# Patient Record
Sex: Male | Born: 1950
Health system: Southern US, Community
[De-identification: ages and names within clinical notes are randomized; demographics above are authoritative.]

## PROBLEM LIST (undated history)

## (undated) DIAGNOSIS — F419 Anxiety disorder, unspecified: Secondary | ICD-10-CM

## (undated) DIAGNOSIS — K219 Gastro-esophageal reflux disease without esophagitis: Secondary | ICD-10-CM

---

## 2007-01-19 ENCOUNTER — Observation Stay: Payer: Self-pay | Admitting: Internal Medicine

## 2007-01-19 ENCOUNTER — Other Ambulatory Visit: Payer: Self-pay

## 2009-11-19 ENCOUNTER — Ambulatory Visit: Payer: Self-pay | Admitting: Family Medicine

## 2013-07-30 ENCOUNTER — Emergency Department: Payer: Self-pay | Admitting: Emergency Medicine

## 2013-07-30 LAB — CBC WITH DIFFERENTIAL/PLATELET
Basophil #: 0.1 10*3/uL (ref 0.0–0.1)
Eosinophil %: 4.3 %
HGB: 16.8 g/dL (ref 13.0–18.0)
Lymphocyte %: 35.2 %
MCH: 31.6 pg (ref 26.0–34.0)
MCHC: 35.1 g/dL (ref 32.0–36.0)
MCV: 90 fL (ref 80–100)
Monocyte #: 0.6 x10 3/mm (ref 0.2–1.0)
Monocyte %: 7.3 %
Neutrophil #: 4 10*3/uL (ref 1.4–6.5)
WBC: 7.7 10*3/uL (ref 3.8–10.6)

## 2013-07-30 LAB — COMPREHENSIVE METABOLIC PANEL
Albumin: 3.6 g/dL (ref 3.4–5.0)
Anion Gap: 4 — ABNORMAL LOW (ref 7–16)
BUN: 19 mg/dL — ABNORMAL HIGH (ref 7–18)
Bilirubin,Total: 0.4 mg/dL (ref 0.2–1.0)
Calcium, Total: 8.6 mg/dL (ref 8.5–10.1)
Chloride: 107 mmol/L (ref 98–107)
Co2: 24 mmol/L (ref 21–32)
Creatinine: 1.02 mg/dL (ref 0.60–1.30)
EGFR (African American): >60
EGFR (Non-African Amer.): >60
Osmolality: 274 (ref 275–301)
SGOT(AST): 33 U/L (ref 15–37)
Total Protein: 7.4 g/dL (ref 6.4–8.2)

## 2014-04-29 ENCOUNTER — Ambulatory Visit: Payer: Self-pay | Admitting: Urology

## 2015-02-13 IMAGING — CT CT HEAD WITHOUT CONTRAST
1 series · 16 of 30 positions shown, 20 images · non-contrast
Comparison: None available for comparison at time of study
interpretation.

CLINICAL DATA: Left-sided body numbness.

EXAM:
CT HEAD WITHOUT CONTRAST
TECHNIQUE: Contiguous axial images were obtained from the base of the skull
through the vertex without intravenous contrast.

[Series 2: head wo · axial · 0.46mm/px · z∈[-152,-17]mm · 16 of 34 slices shown, 20 images]
[im 2/34  brain]
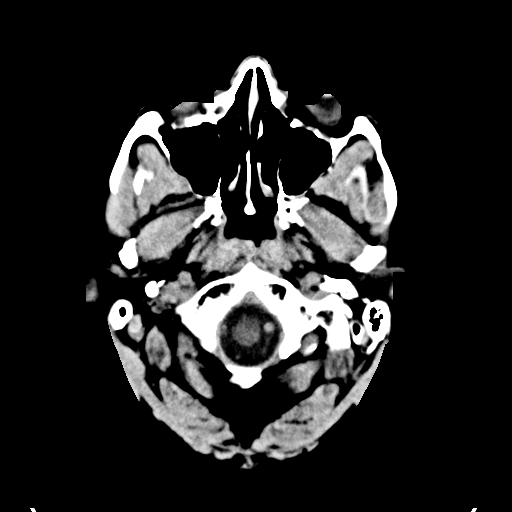
[im 2/34  bone]
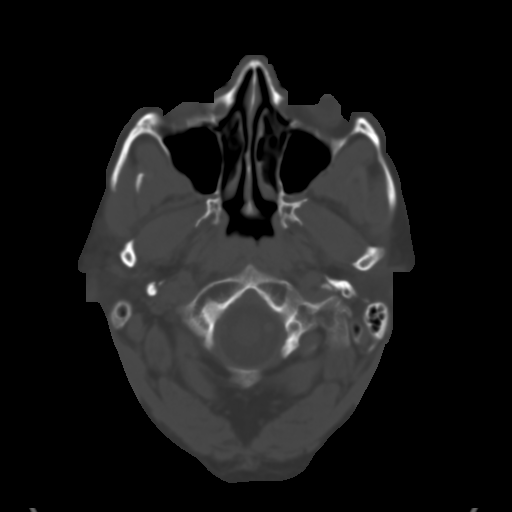
[im 4/34  brain]
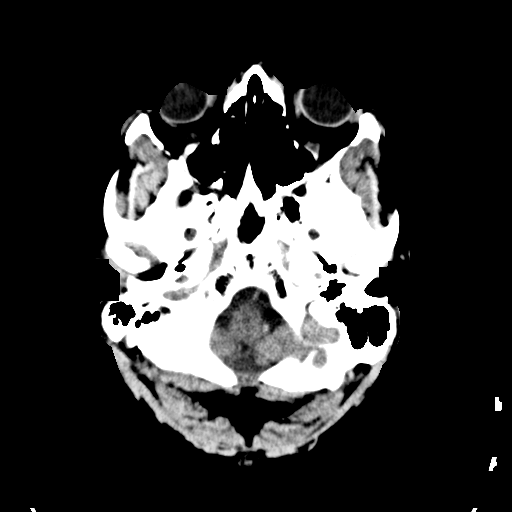
[im 6/34  brain]
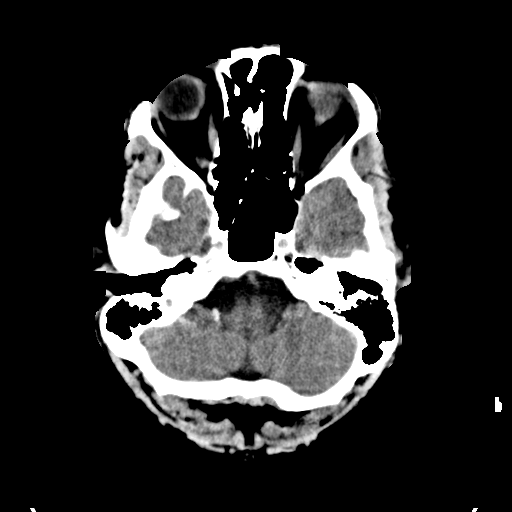
[im 8/34  brain]
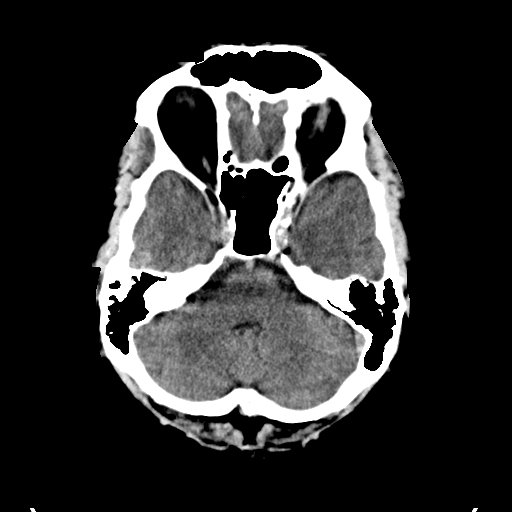
[im 10/34  brain]
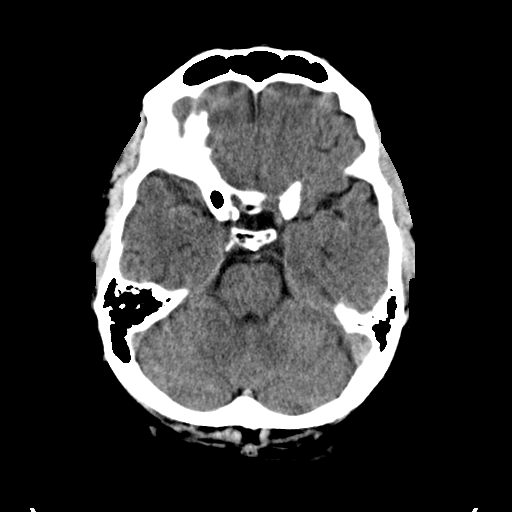
[im 10/34  bone]
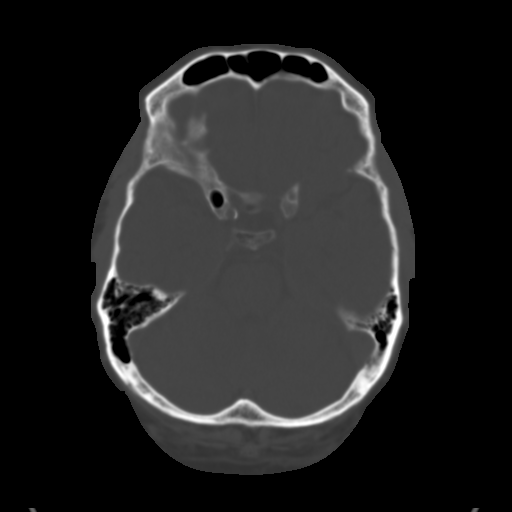
[im 12/34  brain]
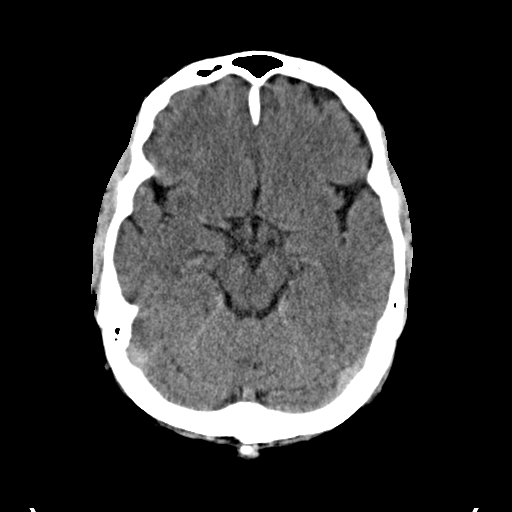
[im 14/34  brain]
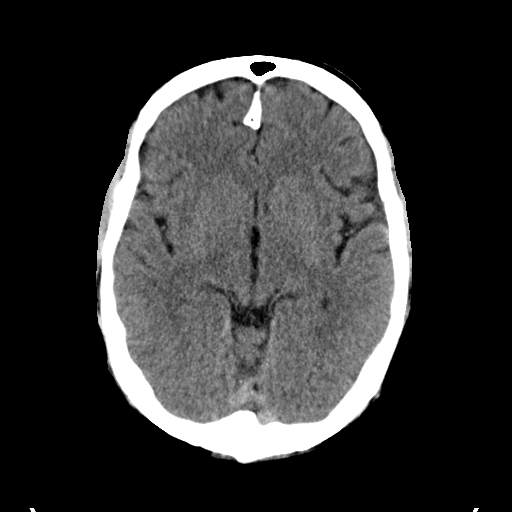
[im 16/34  brain]
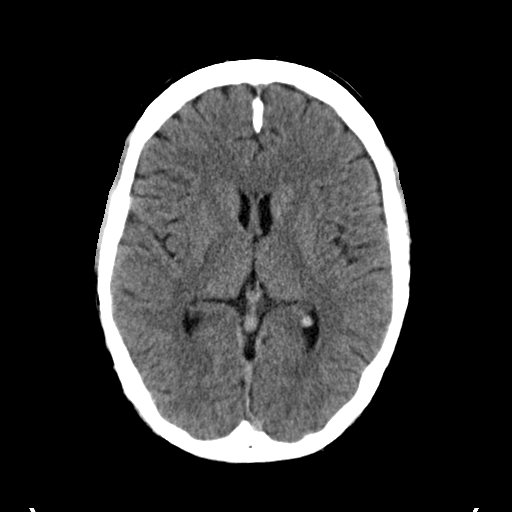
[im 18/34  brain]
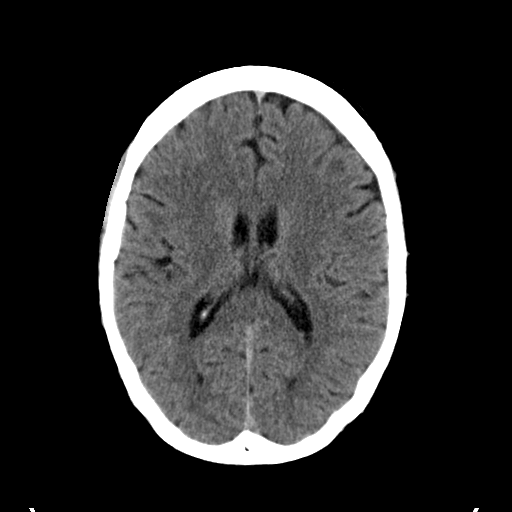
[im 18/34  bone]
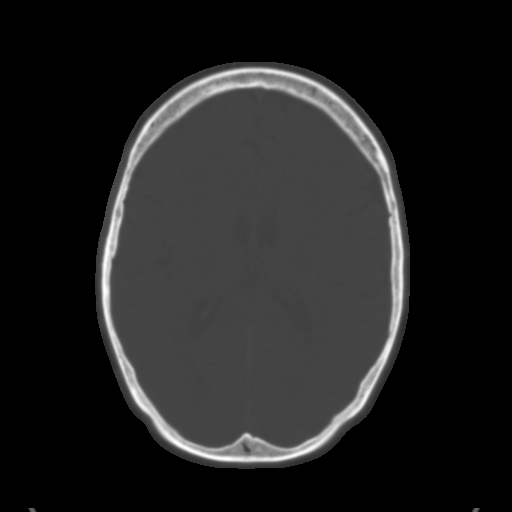
[im 20/34  brain]
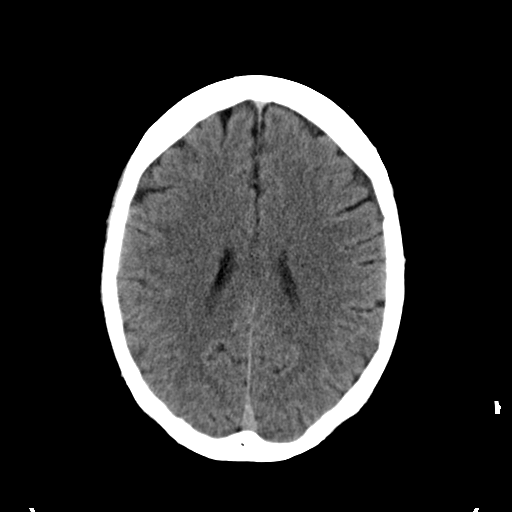
[im 22/34  brain]
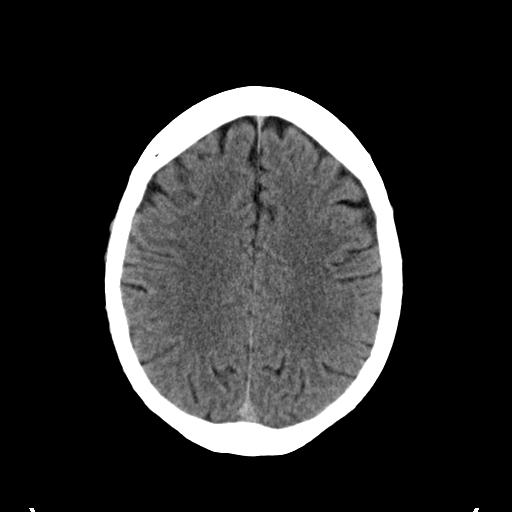
[im 24/34  brain]
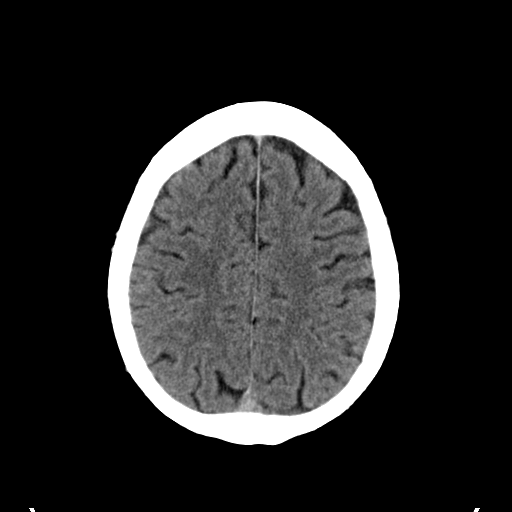
[im 26/34  brain]
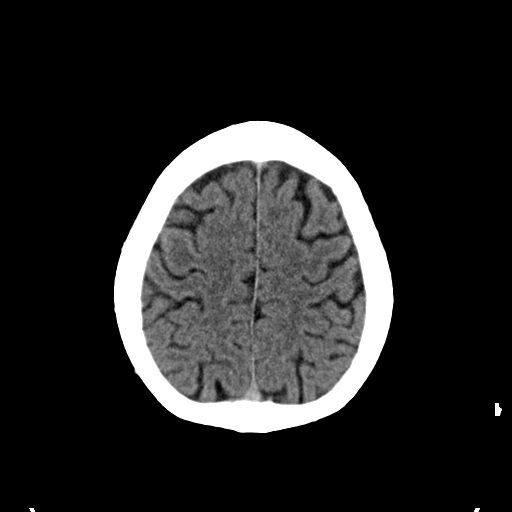
[im 26/34  bone]
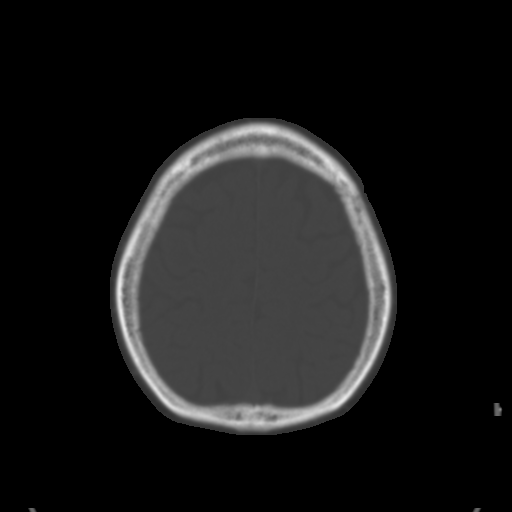
[im 28/34  brain]
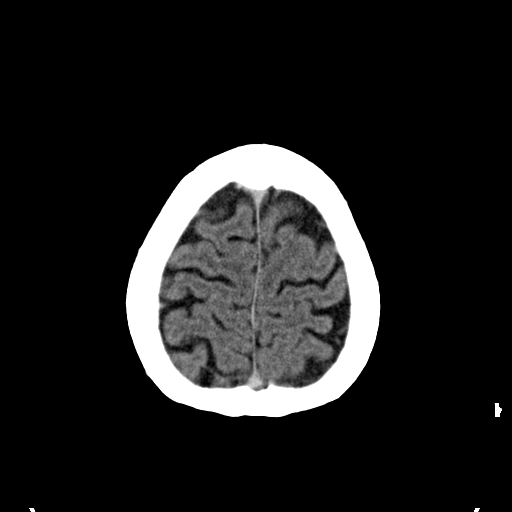
[im 30/34  brain]
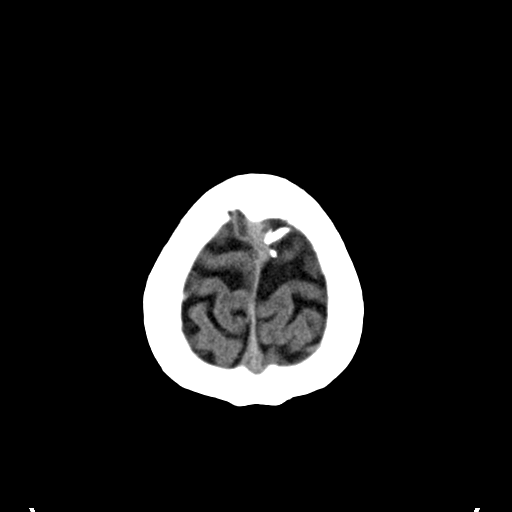
[im 32/34  brain]
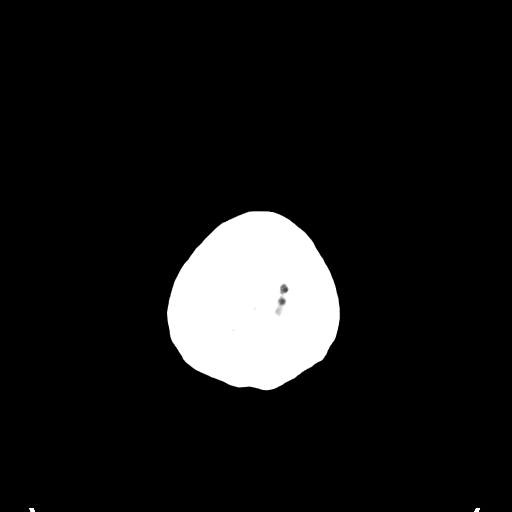

[16 of 30 positions shown; findings below may reference images not displayed]

FINDINGS: The ventricles and sulci are normal. No intraparenchymal hemorrhage,
mass effect nor midline shift. No acute large vascular territory
infarcts.

No abnormal extra-axial fluid collections. Basal cisterns are
patent. Coarse calcification along the anterior falx, benign
finding. Mild calcific atherosclerosis of the carotid siphons.

No skull fracture. Visualized paranasal sinuses and mastoid
air-cells are well-aerated. The included ocular globes and orbital
contents are non-suspicious.
IMPRESSION: No acute intracranial process: Normal noncontrast CT of the head. If
clinical concern for acute ischemia, MRI of the brain with
diffusion-weighted sequences would be more sensitive.

  By: Marky X Stephanie Sookun

## 2015-11-13 IMAGING — CT CT ABDOMEN AND PELVIS WITHOUT AND WITH CONTRAST
2 of 10 series · 10 of 46 positions shown, 16 images · IV contrast (isovue)
Comparison: None.

CLINICAL DATA: Microhematuria

EXAM:
CT ABDOMEN AND PELVIS WITHOUT AND WITH CONTRAST
TECHNIQUE: Multidetector CT imaging of the abdomen and pelvis was performed
following the standard protocol before and following the bolus
administration of intravenous contrast.
CONTRAST:  100 cc Isovue 370

[Series 7: delay · axial · delayed · 0.78mm/px · z∈[-1066,-681]mm · 8 of 100 slices shown, 13 images]
[im 12/100  soft-tissue]
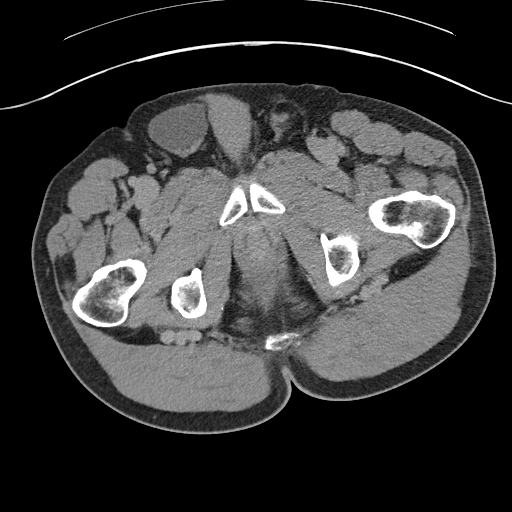
[im 12/100  bone]
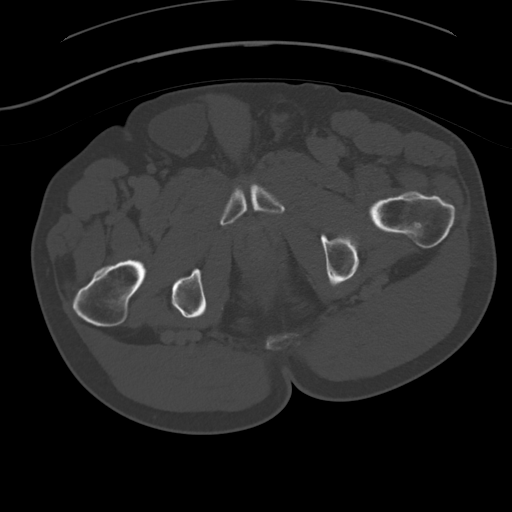
[im 23/100  soft-tissue]
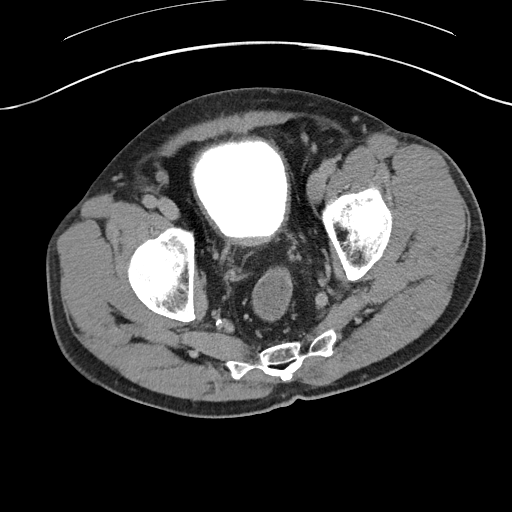
[im 34/100  soft-tissue]
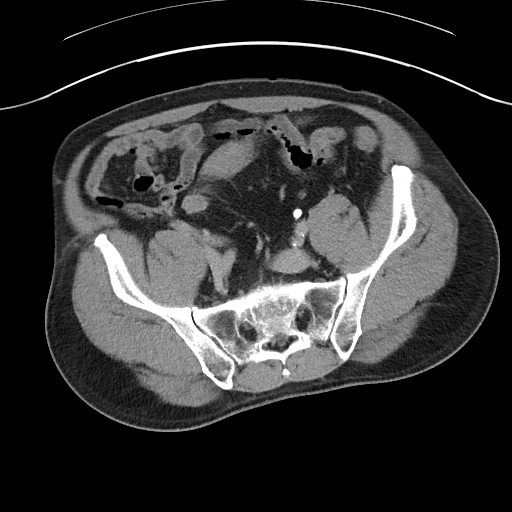
[im 45/100  soft-tissue]
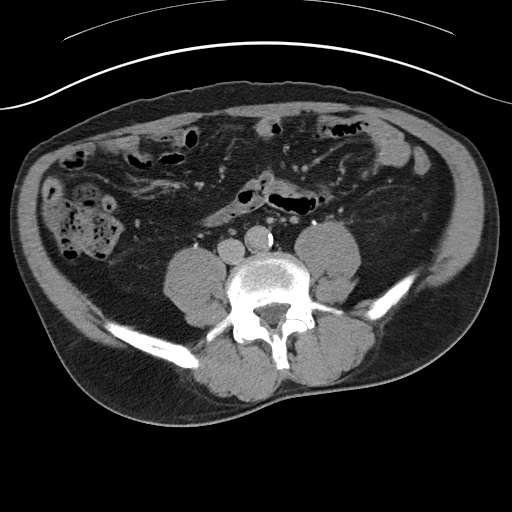
[im 56/100  soft-tissue]
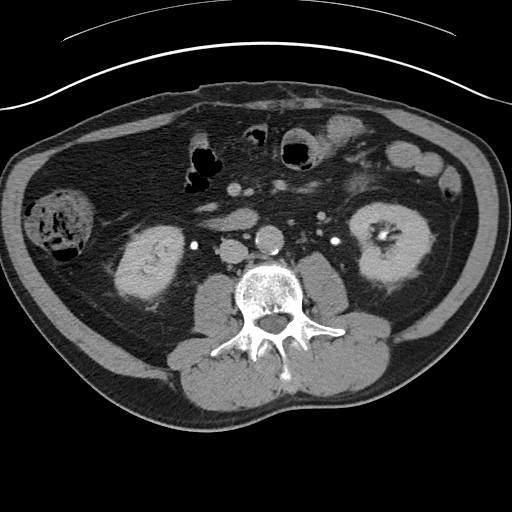
[im 56/100  lung]
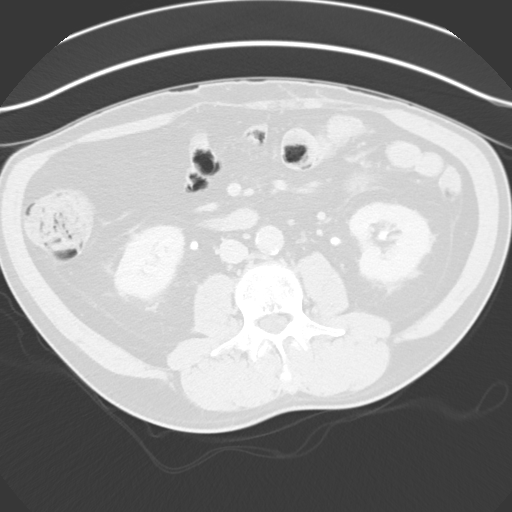
[im 67/100  soft-tissue]
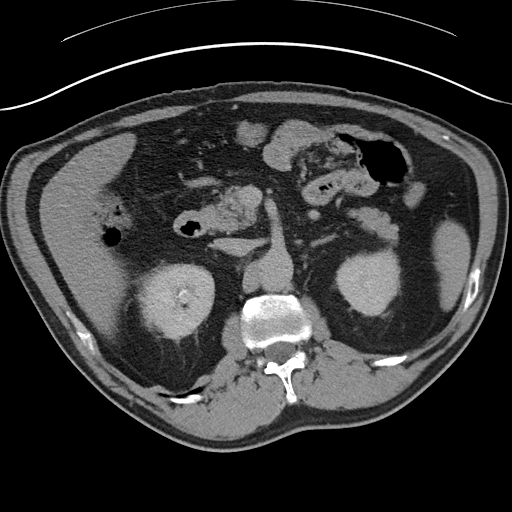
[im 67/100  lung]
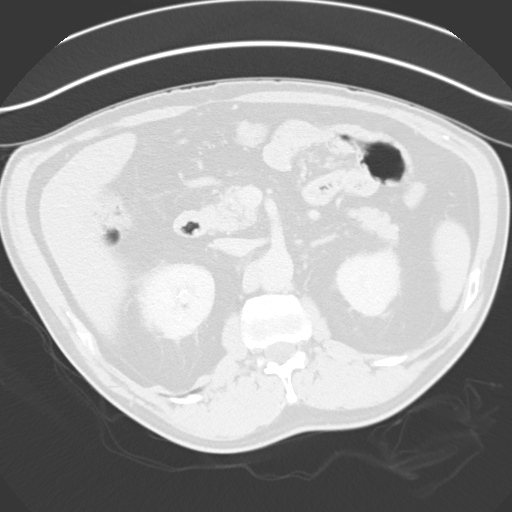
[im 78/100  soft-tissue]
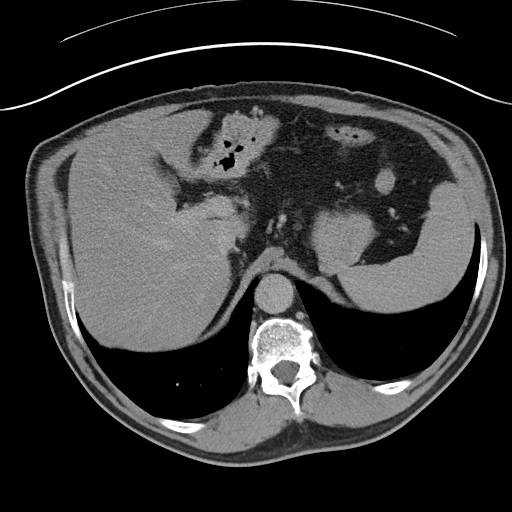
[im 78/100  lung]
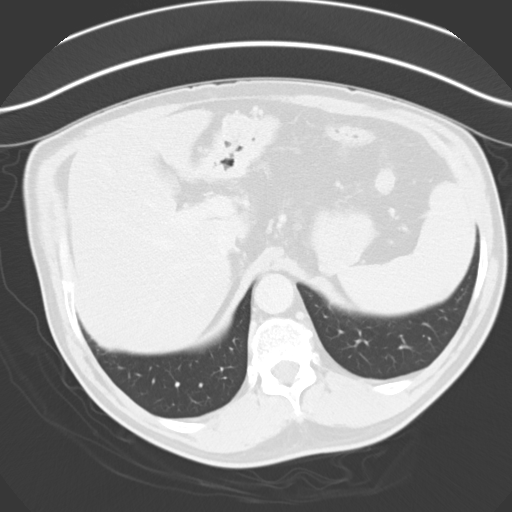
[im 89/100  soft-tissue]
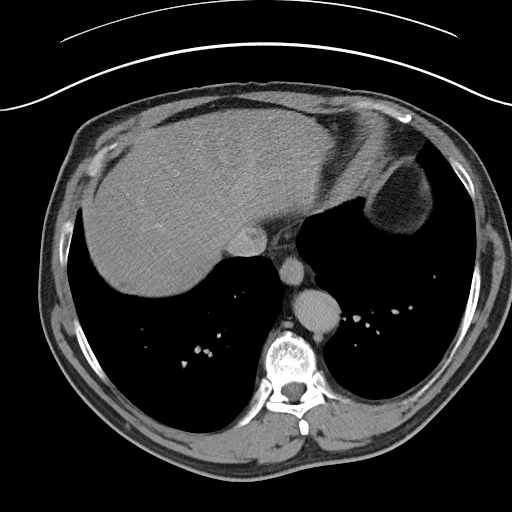
[im 89/100  lung]
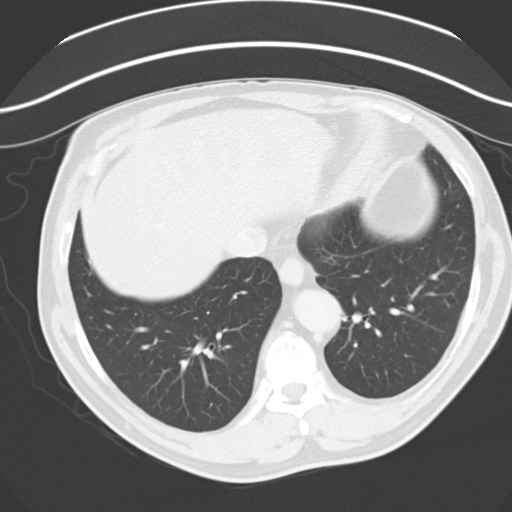

[Series 10: cor hematuria > 45 wo · coronal · 0.81mm/px · 2 of 160 slices shown, 3 images]
[im 54/160  soft-tissue]
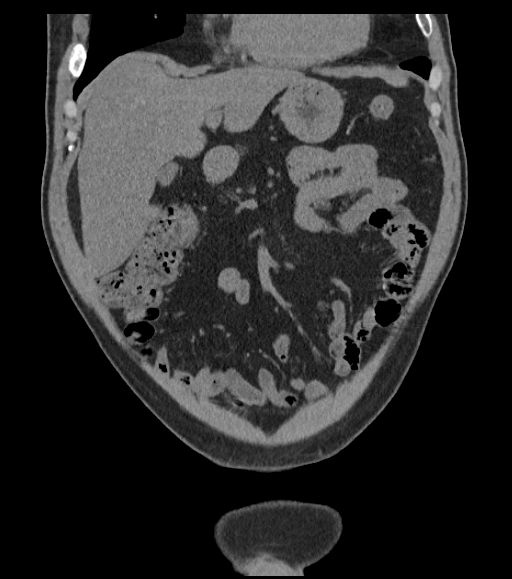
[im 54/160  bone]
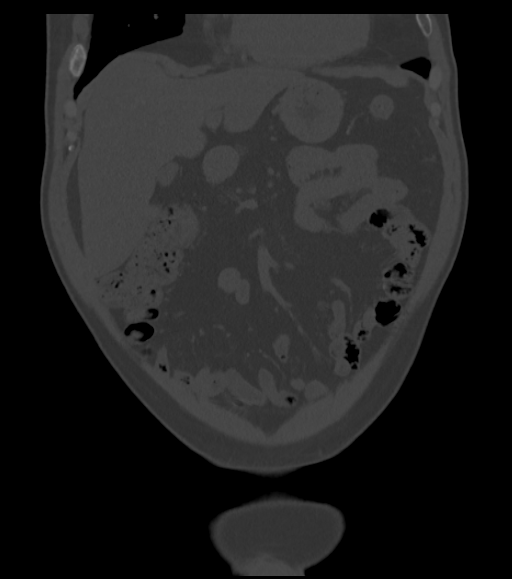
[im 107/160  soft-tissue]
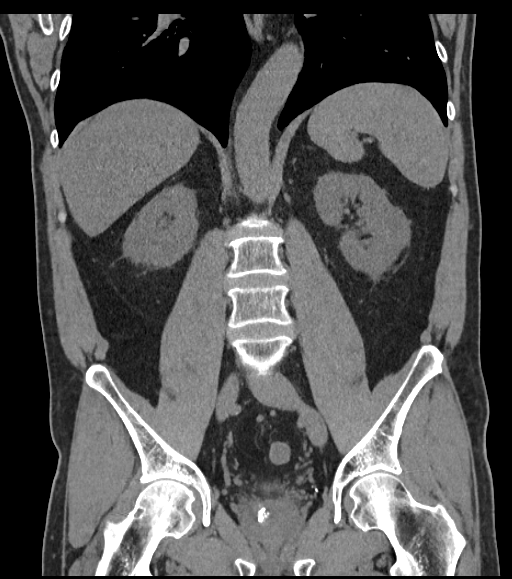

[10 of 46 positions shown; findings below may reference images not displayed]

FINDINGS: Lower chest: The lung bases are clear of acute process. No pleural
effusion or pulmonary lesions. The heart is normal in size. No
pericardial effusion. The distal esophagus and aorta are
unremarkable. Mild tortuosity of the thoracic aorta.

Hepatobiliary: Mild diffuse fatty infiltration of the liver but no
focal hepatic lesions or intrahepatic biliary dilatation. The
gallbladder is normal. No common bowel duct dilatation.

Spleen: Normal

Pancreas: Normal

Stomach/Bowel: The stomach, duodenum, small bowel and colon are
grossly normal without oral contrast. No inflammatory changes, mass
lesions or obstructive findings. The appendix is normal. Small
appendicoliths are noted.

Adrenals/urinary tract: No renal or obstructing ureteral calculi or
bladder calculi. After contrast administration both kidneys
demonstrate normal enhancement/ perfusion. No worrisome renal
lesions. The delayed images do not demonstrate any significant
collecting system abnormalities. Both ureters are normal. The
bladder demonstrates mild diffuse wall thickening which may suggest
partial bladder outlet obstruction due to a slightly enlarged
prostate gland. The seminal vesicles are slightly prominent but
probably within normal limits.

Vascular/Lymphatic: Moderate scattered atherosclerotic
calcifications involving the aorta. No focal aneurysm or dissection.
The branch vessels are patent. The major venous structures are
patent. No mesenteric or retroperitoneal mass or adenopathy. Small
scattered lymph nodes are noted.

Pelvic: No pelvic mass or lymphadenopathy. There are bilateral
inguinal hernias containing fat.

Musculoskeletal: Unremarkable.

Other:
IMPRESSION: 1. No acute abdominal/pelvic findings, mass lesions or
lymphadenopathy.
2. No renal, ureteral or bladder calculi or mass.
3. Mild diffuse bladder wall thickening likely due to partial
bladder outlet obstruction from a mildly enlarged prostate gland
with mild median lobe hypertrophy.
4. Bilateral inguinal hernias containing fat.
5. Moderate atherosclerotic calcifications involving the aorta and
iliac arteries.
6. Mild diffuse fatty infiltration of the liver.

## 2016-08-06 DIAGNOSIS — R718 Other abnormality of red blood cells: Secondary | ICD-10-CM | POA: Diagnosis not present

## 2016-08-06 DIAGNOSIS — R319 Hematuria, unspecified: Secondary | ICD-10-CM | POA: Diagnosis not present

## 2016-08-17 DIAGNOSIS — D696 Thrombocytopenia, unspecified: Secondary | ICD-10-CM | POA: Diagnosis not present

## 2016-08-17 DIAGNOSIS — N390 Urinary tract infection, site not specified: Secondary | ICD-10-CM | POA: Diagnosis not present

## 2017-10-31 DIAGNOSIS — E785 Hyperlipidemia, unspecified: Secondary | ICD-10-CM | POA: Diagnosis not present

## 2017-10-31 DIAGNOSIS — Z125 Encounter for screening for malignant neoplasm of prostate: Secondary | ICD-10-CM | POA: Diagnosis not present

## 2017-10-31 DIAGNOSIS — Z0000 Encounter for general adult medical examination without abnormal findings: Secondary | ICD-10-CM | POA: Diagnosis not present

## 2017-10-31 DIAGNOSIS — K219 Gastro-esophageal reflux disease without esophagitis: Secondary | ICD-10-CM | POA: Diagnosis not present

## 2017-10-31 DIAGNOSIS — E349 Endocrine disorder, unspecified: Secondary | ICD-10-CM | POA: Diagnosis not present

## 2017-10-31 DIAGNOSIS — Z23 Encounter for immunization: Secondary | ICD-10-CM | POA: Diagnosis not present

## 2017-10-31 DIAGNOSIS — F410 Panic disorder [episodic paroxysmal anxiety] without agoraphobia: Secondary | ICD-10-CM | POA: Diagnosis not present

## 2018-07-12 DIAGNOSIS — J209 Acute bronchitis, unspecified: Secondary | ICD-10-CM | POA: Diagnosis not present

## 2018-07-12 DIAGNOSIS — F17200 Nicotine dependence, unspecified, uncomplicated: Secondary | ICD-10-CM | POA: Diagnosis not present

## 2019-02-23 DIAGNOSIS — E785 Hyperlipidemia, unspecified: Secondary | ICD-10-CM | POA: Diagnosis not present

## 2019-02-23 DIAGNOSIS — K219 Gastro-esophageal reflux disease without esophagitis: Secondary | ICD-10-CM | POA: Diagnosis not present

## 2019-02-23 DIAGNOSIS — F410 Panic disorder [episodic paroxysmal anxiety] without agoraphobia: Secondary | ICD-10-CM | POA: Diagnosis not present

## 2019-02-23 DIAGNOSIS — Z0000 Encounter for general adult medical examination without abnormal findings: Secondary | ICD-10-CM | POA: Diagnosis not present

## 2019-02-23 DIAGNOSIS — Z125 Encounter for screening for malignant neoplasm of prostate: Secondary | ICD-10-CM | POA: Diagnosis not present

## 2019-02-23 DIAGNOSIS — E349 Endocrine disorder, unspecified: Secondary | ICD-10-CM | POA: Diagnosis not present

## 2019-03-13 ENCOUNTER — Other Ambulatory Visit: Payer: Self-pay | Admitting: Family Medicine

## 2019-03-13 DIAGNOSIS — Z8249 Family history of ischemic heart disease and other diseases of the circulatory system: Secondary | ICD-10-CM

## 2019-03-13 DIAGNOSIS — F17200 Nicotine dependence, unspecified, uncomplicated: Secondary | ICD-10-CM

## 2019-03-20 ENCOUNTER — Ambulatory Visit: Payer: PPO

## 2019-03-20 DIAGNOSIS — K219 Gastro-esophageal reflux disease without esophagitis: Secondary | ICD-10-CM | POA: Diagnosis not present

## 2019-03-20 DIAGNOSIS — E349 Endocrine disorder, unspecified: Secondary | ICD-10-CM | POA: Diagnosis not present

## 2019-03-20 DIAGNOSIS — Z86010 Personal history of colonic polyps: Secondary | ICD-10-CM | POA: Diagnosis not present

## 2019-03-20 DIAGNOSIS — F410 Panic disorder [episodic paroxysmal anxiety] without agoraphobia: Secondary | ICD-10-CM | POA: Diagnosis not present

## 2019-03-20 DIAGNOSIS — E785 Hyperlipidemia, unspecified: Secondary | ICD-10-CM | POA: Diagnosis not present

## 2019-03-27 ENCOUNTER — Telehealth: Payer: Self-pay | Admitting: *Deleted

## 2019-03-27 NOTE — Telephone Encounter (Signed)
Received referral for low dose lung cancer screening CT scan. Message left at phone number listed in EMR for patient to call me back to facilitate scheduling scan.  

## 2019-04-03 ENCOUNTER — Telehealth: Payer: Self-pay | Admitting: *Deleted

## 2019-04-03 NOTE — Telephone Encounter (Signed)
Received referral for lung screening scan. Contacted patient and discussed screening. Patient request to postpone scan until December of this year.

## 2019-07-03 DIAGNOSIS — F17200 Nicotine dependence, unspecified, uncomplicated: Secondary | ICD-10-CM | POA: Diagnosis not present

## 2019-07-03 DIAGNOSIS — H6981 Other specified disorders of Eustachian tube, right ear: Secondary | ICD-10-CM | POA: Diagnosis not present

## 2019-07-03 DIAGNOSIS — R030 Elevated blood-pressure reading, without diagnosis of hypertension: Secondary | ICD-10-CM | POA: Diagnosis not present

## 2019-07-09 ENCOUNTER — Other Ambulatory Visit
Admission: RE | Admit: 2019-07-09 | Discharge: 2019-07-09 | Disposition: A | Discharge status: Home / Self Care | Payer: PPO | Source: Ambulatory Visit | Attending: Internal Medicine | Admitting: Internal Medicine

## 2019-07-09 ENCOUNTER — Other Ambulatory Visit: Payer: Self-pay

## 2019-07-09 DIAGNOSIS — Z01812 Encounter for preprocedural laboratory examination: Secondary | ICD-10-CM | POA: Diagnosis not present

## 2019-07-09 DIAGNOSIS — Z20828 Contact with and (suspected) exposure to other viral communicable diseases: Secondary | ICD-10-CM | POA: Diagnosis not present

## 2019-07-10 LAB — SARS CORONAVIRUS 2 (TAT 6-24 HRS): SARS Coronavirus 2: NEGATIVE

## 2019-07-11 ENCOUNTER — Encounter: Payer: Self-pay | Admitting: *Deleted

## 2019-07-12 ENCOUNTER — Encounter: Payer: Self-pay | Admitting: Internal Medicine

## 2019-07-12 ENCOUNTER — Encounter
Admission: RE | Disposition: A | Discharge status: Home / Self Care | Payer: Self-pay | Source: Home / Self Care | Attending: Internal Medicine

## 2019-07-12 ENCOUNTER — Ambulatory Visit
Admission: RE | Admit: 2019-07-12 | Discharge: 2019-07-12 | Disposition: A | Discharge status: Home / Self Care | Payer: PPO | Attending: Internal Medicine | Admitting: Internal Medicine

## 2019-07-12 ENCOUNTER — Ambulatory Visit: Payer: PPO | Admitting: Certified Registered Nurse Anesthetist

## 2019-07-12 ENCOUNTER — Other Ambulatory Visit: Payer: Self-pay

## 2019-07-12 DIAGNOSIS — Z79899 Other long term (current) drug therapy: Secondary | ICD-10-CM | POA: Insufficient documentation

## 2019-07-12 DIAGNOSIS — K621 Rectal polyp: Secondary | ICD-10-CM | POA: Diagnosis not present

## 2019-07-12 DIAGNOSIS — K635 Polyp of colon: Secondary | ICD-10-CM | POA: Diagnosis not present

## 2019-07-12 DIAGNOSIS — Z1211 Encounter for screening for malignant neoplasm of colon: Secondary | ICD-10-CM | POA: Insufficient documentation

## 2019-07-12 DIAGNOSIS — K5730 Diverticulosis of large intestine without perforation or abscess without bleeding: Secondary | ICD-10-CM | POA: Diagnosis not present

## 2019-07-12 DIAGNOSIS — K640 First degree hemorrhoids: Secondary | ICD-10-CM | POA: Insufficient documentation

## 2019-07-12 DIAGNOSIS — Z7982 Long term (current) use of aspirin: Secondary | ICD-10-CM | POA: Diagnosis not present

## 2019-07-12 DIAGNOSIS — F17200 Nicotine dependence, unspecified, uncomplicated: Secondary | ICD-10-CM | POA: Diagnosis not present

## 2019-07-12 DIAGNOSIS — K219 Gastro-esophageal reflux disease without esophagitis: Secondary | ICD-10-CM | POA: Insufficient documentation

## 2019-07-12 DIAGNOSIS — Z86010 Personal history of colonic polyps: Secondary | ICD-10-CM | POA: Insufficient documentation

## 2019-07-12 DIAGNOSIS — K5520 Angiodysplasia of colon without hemorrhage: Secondary | ICD-10-CM | POA: Diagnosis not present

## 2019-07-12 DIAGNOSIS — K5790 Diverticulosis of intestine, part unspecified, without perforation or abscess without bleeding: Secondary | ICD-10-CM | POA: Diagnosis not present

## 2019-07-12 DIAGNOSIS — Q2733 Arteriovenous malformation of digestive system vessel: Secondary | ICD-10-CM | POA: Diagnosis not present

## 2019-07-12 DIAGNOSIS — Z7689 Persons encountering health services in other specified circumstances: Secondary | ICD-10-CM | POA: Diagnosis not present

## 2019-07-12 DIAGNOSIS — D123 Benign neoplasm of transverse colon: Secondary | ICD-10-CM | POA: Insufficient documentation

## 2019-07-12 DIAGNOSIS — K649 Unspecified hemorrhoids: Secondary | ICD-10-CM | POA: Diagnosis not present

## 2019-07-12 HISTORY — PX: COLONOSCOPY WITH PROPOFOL: SHX5780

## 2019-07-12 HISTORY — DX: Gastro-esophageal reflux disease without esophagitis: K21.9

## 2019-07-12 HISTORY — DX: Anxiety disorder, unspecified: F41.9

## 2019-07-12 SURGERY — COLONOSCOPY WITH PROPOFOL
Anesthesia: General

## 2019-07-12 MED ORDER — SPOT INK MARKER SYRINGE KIT
PACK | SUBMUCOSAL | Status: DC | PRN
  Administered 2019-07-12: 2 mL via SUBMUCOSAL

## 2019-07-12 MED ORDER — LIDOCAINE HCL (CARDIAC) PF 100 MG/5ML IV SOSY
PREFILLED_SYRINGE | INTRAVENOUS | Status: DC | PRN
  Administered 2019-07-12: 50 mg via INTRAVENOUS

## 2019-07-12 MED ORDER — PROPOFOL 10 MG/ML IV BOLUS
INTRAVENOUS | Status: DC | PRN
  Administered 2019-07-12: 70 mg via INTRAVENOUS
  Administered 2019-07-12: 20 mg via INTRAVENOUS
  Administered 2019-07-12: 30 mg via INTRAVENOUS

## 2019-07-12 MED ORDER — PROPOFOL 500 MG/50ML IV EMUL
INTRAVENOUS | Status: AC
  Filled 2019-07-12: qty 50

## 2019-07-12 MED ORDER — SODIUM CHLORIDE 0.9 % IV SOLN
INTRAVENOUS | Status: DC
  Administered 2019-07-12: 10:00:00 via INTRAVENOUS

## 2019-07-12 MED ORDER — PROPOFOL 500 MG/50ML IV EMUL
INTRAVENOUS | Status: DC | PRN
  Administered 2019-07-12: 175 ug/kg/min via INTRAVENOUS

## 2019-07-12 NOTE — Interval H&P Note (Signed)
History and Physical Interval Note:  07/12/2019 9:38 AM  Bruce Phillips  has presented today for surgery, with the diagnosis of PHCP.  The various methods of treatment have been discussed with the patient and family. After consideration of risks, benefits and other options for treatment, the patient has consented to  Procedure(s): COLONOSCOPY WITH PROPOFOL (N/A) as a surgical intervention.  The patient's history has been reviewed, patient examined, no change in status, stable for surgery.  I have reviewed the patient's chart and labs.  Questions were answered to the patient's satisfaction.     [place], [employer]

## 2019-07-12 NOTE — Anesthesia Preprocedure Evaluation (Signed)
Anesthesia Evaluation  Patient identified by MRN, date of birth, ID band Patient awake    Reviewed: Allergy & Precautions, H&P , NPO status , Patient's Chart, lab work & pertinent test results, reviewed documented beta blocker date and time   Airway Mallampati: II   Neck ROM: full    Dental  (+) Poor Dentition   Pulmonary neg pulmonary ROS, Current Smoker and Patient abstained from smoking.,    Pulmonary exam normal        Cardiovascular Exercise Tolerance: Good negative cardio ROS Normal cardiovascular exam Rhythm:regular Rate:Normal     Neuro/Psych Anxiety negative neurological ROS  negative psych ROS   GI/Hepatic Neg liver ROS, GERD  Medicated,  Endo/Other  negative endocrine ROS  Renal/GU negative Renal ROS  negative genitourinary   Musculoskeletal   Abdominal   Peds  Hematology negative hematology ROS (+)   Anesthesia Other Findings Past Medical History: No date: Anxiety No date: GERD (gastroesophageal reflux disease) History reviewed. No pertinent surgical history. BMI    Body Mass Index: 30.42 kg/m     Reproductive/Obstetrics negative OB ROS                             Anesthesia Physical Anesthesia Plan  ASA: II  Anesthesia Plan: General   Post-op Pain Management:    Induction:   PONV Risk Score and Plan:   Airway Management Planned:   Additional Equipment:   Intra-op Plan:   Post-operative Plan:   Informed Consent: I have reviewed the patients History and Physical, chart, labs and discussed the procedure including the risks, benefits and alternatives for the proposed anesthesia with the patient or authorized representative who has indicated his/her understanding and acceptance.     Dental Advisory Given  Plan Discussed with: CRNA  Anesthesia Plan Comments:         Anesthesia Quick Evaluation

## 2019-07-12 NOTE — Transfer of Care (Signed)
Immediate Anesthesia Transfer of Care Note  Patient: Bruce Phillips  Procedure(s) Performed: COLONOSCOPY WITH PROPOFOL (N/A )  Patient Location: PACU  Anesthesia Type:General  Level of Consciousness: sedated  Airway & Oxygen Therapy: Patient Spontanous Breathing and Patient connected to nasal cannula oxygen  Post-op Assessment: Report given to RN and Post -op Vital signs reviewed and stable  Post vital signs: Reviewed and stable  Last Vitals:  Vitals Value Taken Time  BP 98/66 07/12/19 1049  Temp 36.1 C 07/12/19 1046  Pulse 82 07/12/19 1049  Resp 25 07/12/19 1049  SpO2 97 % 07/12/19 1049  Vitals shown include unvalidated device data.  Last Pain:  Vitals:   07/12/19 1046  TempSrc: Temporal  PainSc:          Complications: No apparent anesthesia complications

## 2019-07-12 NOTE — Anesthesia Post-op Follow-up Note (Signed)
Anesthesia QCDR form completed.        

## 2019-07-12 NOTE — Op Note (Addendum)
[facility]enter[facility] Patient Name: Joachim Mashni Procedure Date: 07/12/2019 9:38 AM MRN: [id number] Account #: [id number] Date of Birth: [birth year] Admit Type: Outpatient Age: 68 Room: [facility] ENDO ROOM 1 Gender: Male Note Status: Finalized Procedure:             Colonoscopy Indications:           Surveillance: Personal history of adenomatous polyps                         on last colonoscopy > 5 years ago Providers:             Lorie Apley K. Alice Reichert MD, MD Referring MD:          Youlanda Roys. Lovie Macadamia, MD (Referring MD) Medicines:             Propofol per Anesthesia Complications:         No immediate complications. Estimated blood loss:                         Minimal. Procedure:             Pre-Anesthesia Assessment:                        - The risks and benefits of the procedure and the                         sedation options and risks were discussed with the                         patient. All questions were answered and informed                         consent was obtained.                        - Patient identification and proposed procedure were                         verified prior to the procedure by the nurse. The                         procedure was verified in the procedure room.                        - ASA Grade Assessment: III - A patient with severe                         systemic disease.                        - After reviewing the risks and benefits, the patient                         was deemed in satisfactory condition to undergo the                         procedure.                        After obtaining informed consent, the colonoscope  was                         passed under direct vision. Throughout the procedure,                         the patient's blood pressure, pulse, and oxygen                         saturations were monitored continuously. The                         Colonoscope was introduced through the anus and                    advanced to the the cecum, identified by appendiceal                         orifice and ileocecal valve. The colonoscopy was                         performed without difficulty. The patient tolerated                         the procedure well. The quality of the bowel                         preparation was good. The ileocecal valve, appendiceal                         orifice, and rectum were photographed. Findings:      The perianal and digital rectal examinations were normal. Pertinent       negatives include normal sphincter tone and no palpable rectal lesions.      A few small-mouthed diverticula were found in the sigmoid colon.      Two sessile polyps were found in the rectum and transverse colon. The       polyps were 3 to 5 mm in size. These polyps were removed with a cold       biopsy forceps. Resection and retrieval were complete.      A 16 mm polyp was found in the hepatic flexure. The polyp was sessile.       The polyp was removed with a piecemeal technique using a hot snare at 30       watts. Resection and retrieval were complete using a suction (via the       working channel). Three hemostatic clips were successfully placed (MR       conditional). Area was successfully injected with 2 mL Spot (carbon       black) for tattooing. Estimated blood loss was minimal.      Internal hemorrhoids were found during retroflexion. The hemorrhoids       were Grade I (internal hemorrhoids that do not prolapse).      The exam was otherwise without abnormality.      A single small localized angioectasia without bleeding was found in the       cecum. Impression:            - Diverticulosis in the sigmoid colon.                        -  Two 3 to 5 mm polyps in the rectum and in the                         transverse colon, removed with a cold biopsy forceps.                         Resected and retrieved.                        - One 16 mm polyp at the hepatic flexure,  removed                         piecemeal using a hot snare. Resected and retrieved.                         Clips (MR conditional) were placed. Injected.                        - Internal hemorrhoids.                        - The examination was otherwise normal. Recommendation:        - Patient has a contact number available for                         emergencies. The signs and symptoms of potential                         delayed complications were discussed with the patient.                         Return to normal activities tomorrow. Written                         discharge instructions were provided to the patient.                        - No aspirin, ibuprofen, naproxen, or other                         non-steroidal anti-inflammatory drugs for 2 weeks                         after polyp removal.                        - Await pathology results.                        - Repeat colonoscopy date to be determined after                         pending pathology results are reviewed for                         surveillance.                        - Return to GI office PRN.                        -  The findings and recommendations were discussed with                         the patient. Procedure Code(s):     --- Professional ---                        [id number], Colonoscopy, flexible; with removal of                         tumor(s), polyp(s), or other lesion(s) by snare                         technique                        45380, 79, Colonoscopy, flexible; with biopsy, single                         or multiple                        45381, Colonoscopy, flexible; with directed submucosal                         injection(s), any substance Diagnosis Code(s):     --- Professional ---                        K57.30, Diverticulosis of large intestine without                         perforation or abscess without bleeding                        K63.5, Polyp of colon                         K62.1, Rectal polyp                        K64.0, First degree hemorrhoids                        Z86.010, Personal history of colonic polyps CPT copyright 2019 [employer]. All rights reserved. The codes documented in this report are preliminary and upon coder review may  be revised to meet current compliance requirements. Efrain Sella MD, MD 07/12/2019 10:50:48 AM This report has been signed electronically. Number of Addenda: 0 Note Initiated On: 07/12/2019 9:38 AM Scope Withdrawal Time: 0 hours 31 minutes 5 seconds  Total Procedure Duration: 0 hours 35 minutes 24 seconds  Estimated Blood Loss:  Estimated blood loss was minimal. Estimated blood loss                         was minimal. Estimated blood loss: none.      [facility]

## 2019-07-12 NOTE — Anesthesia Procedure Notes (Signed)
Date/Time: 07/12/2019 10:02 AM Performed by: Johnna Acosta, CRNA Pre-anesthesia Checklist: Patient identified, Emergency Drugs available, Suction available, Patient being monitored and Timeout performed Patient Re-evaluated:Patient Re-evaluated prior to induction Oxygen Delivery Method: Nasal cannula Preoxygenation: Pre-oxygenation with 100% oxygen Induction Type: IV induction

## 2019-07-12 NOTE — Anesthesia Postprocedure Evaluation (Signed)
Anesthesia Post Note  Patient: Bruce Phillips  Procedure(s) Performed: COLONOSCOPY WITH PROPOFOL (N/A )  Patient location during evaluation: Endoscopy Anesthesia Type: General Level of consciousness: awake and alert and oriented Pain management: pain level controlled Vital Signs Assessment: post-procedure vital signs reviewed and stable Respiratory status: spontaneous breathing Cardiovascular status: blood pressure returned to baseline Anesthetic complications: no     Last Vitals:  Vitals:   07/12/19 1106 07/12/19 1116  BP: 108/79 123/83  Pulse: 86 76  Resp: 18 15  Temp:    SpO2: 96% 96%    Last Pain:  Vitals:   07/12/19 1116  TempSrc:   PainSc: 0-No pain                 Anshi Jalloh

## 2019-07-12 NOTE — Anesthesia Postprocedure Evaluation (Deleted)
Anesthesia Post Note  Patient: CHIBUEZE GALANTE  Procedure(s) Performed: COLONOSCOPY WITH PROPOFOL (N/A )  Patient location during evaluation: PACU Anesthesia Type: General Level of consciousness: awake and alert Pain management: pain level controlled Vital Signs Assessment: post-procedure vital signs reviewed and stable Respiratory status: spontaneous breathing, nonlabored ventilation, respiratory function stable and patient connected to nasal cannula oxygen Cardiovascular status: blood pressure returned to baseline and stable Postop Assessment: no apparent nausea or vomiting Anesthetic complications: no     Last Vitals:  Vitals:   07/12/19 0926  BP: (!) 129/94  Pulse: 90  Resp: 18  Temp: 36.5 C  SpO2: 95%    Last Pain:  Vitals:   07/12/19 0926  PainSc: 0-No pain                 Molli Barrows

## 2019-07-12 NOTE — H&P (Signed)
Outpatient short stay form Pre-procedure 07/12/2019 9:37 AM Bruce Phillips K. Alice Reichert, M.D.  Primary Physician: Juluis Pitch, M.D.  Reason for visit:  Personal history of adenomatous colon polyp x 1, 2014.  History of present illness:                            Patient presents for colonoscopy for a personal hx of colon polyps. The patient denies abdominal pain, abnormal weight loss or rectal bleeding.     Current Facility-Administered Medications:  .  0.9 %  sodium chloride infusion, , Intravenous, Continuous, Orian Figueira, Benay Pike, MD  Medications Prior to Admission  Medication Sig Dispense Refill Last Dose  . aspirin 325 MG tablet Take 325 mg by mouth daily.   Past Week  . citalopram (CELEXA) 20 MG tablet Take 20 mg by mouth daily.   Past Week at Unknown time  . Coenzyme Q10 (UBIDECARENONE) POWD by Does not apply route.   Past Week at Unknown time  . fluticasone (FLONASE) 50 MCG/ACT nasal spray Place 2 sprays into both nostrils daily.   Past Week at Unknown time  . omeprazole (PRILOSEC) 20 MG capsule Take 20 mg by mouth 2 (two) times daily before a meal.   Past Week at Unknown time  . sildenafil (REVATIO) 20 MG tablet Take 20 mg by mouth as needed (1-5 tabs as needed).   Past Week at Unknown time  . simvastatin (ZOCOR) 20 MG tablet Take 20 mg by mouth daily.   Past Week at Unknown time     Not on File   Past Medical History:  Diagnosis Date  . Anxiety   . GERD (gastroesophageal reflux disease)     Review of systems:  Otherwise negative.    Physical Exam  Gen: Alert, oriented. Appears stated age.  HEENT: [place]/AT. PERRLA. Lungs: CTA, no wheezes. CV: RR nl S1, S2. Abd: soft, benign, no masses. BS+ Ext: No edema. Pulses 2+    Planned procedures: Proceed with colonoscopy. The patient understands the nature of the planned procedure, indications, risks, alternatives and potential complications including but not limited to bleeding, infection, perforation, damage to internal organs  and possible oversedation/side effects from anesthesia. The patient agrees and gives consent to proceed.  Please refer to procedure notes for findings, recommendations and patient disposition/instructions.     Allyce Bochicchio K. Alice Reichert, M.D. Gastroenterology 07/12/2019  9:37 AM

## 2019-07-13 ENCOUNTER — Encounter: Payer: Self-pay | Admitting: *Deleted

## 2019-07-13 LAB — SURGICAL PATHOLOGY

## 2019-07-17 ENCOUNTER — Telehealth: Payer: Self-pay | Admitting: *Deleted

## 2019-07-17 NOTE — Telephone Encounter (Signed)
Left a voicemail for patient to notify him that it is time to schedule annual low dose lung cancer screening CT scan. Instructed patient to call back to verify information prior to the scan being scheduled.  

## 2019-07-24 DIAGNOSIS — E785 Hyperlipidemia, unspecified: Secondary | ICD-10-CM | POA: Diagnosis not present

## 2019-07-24 DIAGNOSIS — R52 Pain, unspecified: Secondary | ICD-10-CM | POA: Diagnosis not present

## 2019-07-24 DIAGNOSIS — F1010 Alcohol abuse, uncomplicated: Secondary | ICD-10-CM | POA: Diagnosis not present

## 2019-07-24 DIAGNOSIS — F17200 Nicotine dependence, unspecified, uncomplicated: Secondary | ICD-10-CM | POA: Diagnosis not present

## 2019-07-24 DIAGNOSIS — F1020 Alcohol dependence, uncomplicated: Secondary | ICD-10-CM | POA: Diagnosis not present

## 2019-07-24 DIAGNOSIS — R1084 Generalized abdominal pain: Secondary | ICD-10-CM | POA: Diagnosis not present

## 2019-07-24 DIAGNOSIS — Z20828 Contact with and (suspected) exposure to other viral communicable diseases: Secondary | ICD-10-CM | POA: Diagnosis not present

## 2019-07-24 DIAGNOSIS — E7800 Pure hypercholesterolemia, unspecified: Secondary | ICD-10-CM | POA: Diagnosis not present

## 2019-07-24 DIAGNOSIS — K760 Fatty (change of) liver, not elsewhere classified: Secondary | ICD-10-CM | POA: Diagnosis not present

## 2019-07-24 DIAGNOSIS — R58 Hemorrhage, not elsewhere classified: Secondary | ICD-10-CM | POA: Diagnosis not present

## 2019-07-24 DIAGNOSIS — F17210 Nicotine dependence, cigarettes, uncomplicated: Secondary | ICD-10-CM | POA: Diagnosis not present

## 2019-07-24 DIAGNOSIS — K219 Gastro-esophageal reflux disease without esophagitis: Secondary | ICD-10-CM | POA: Diagnosis not present

## 2019-07-24 DIAGNOSIS — K921 Melena: Secondary | ICD-10-CM | POA: Diagnosis not present

## 2019-07-24 DIAGNOSIS — Z01818 Encounter for other preprocedural examination: Secondary | ICD-10-CM | POA: Diagnosis not present

## 2019-07-24 DIAGNOSIS — Z86010 Personal history of colonic polyps: Secondary | ICD-10-CM | POA: Diagnosis not present

## 2019-07-24 DIAGNOSIS — K91840 Postprocedural hemorrhage and hematoma of a digestive system organ or structure following a digestive system procedure: Secondary | ICD-10-CM | POA: Diagnosis not present

## 2019-07-24 DIAGNOSIS — K922 Gastrointestinal hemorrhage, unspecified: Secondary | ICD-10-CM | POA: Diagnosis not present

## 2019-07-24 DIAGNOSIS — R1030 Lower abdominal pain, unspecified: Secondary | ICD-10-CM | POA: Diagnosis not present

## 2019-07-24 DIAGNOSIS — R609 Edema, unspecified: Secondary | ICD-10-CM | POA: Diagnosis not present

## 2019-07-24 DIAGNOSIS — R42 Dizziness and giddiness: Secondary | ICD-10-CM | POA: Diagnosis not present

## 2019-07-24 DIAGNOSIS — I10 Essential (primary) hypertension: Secondary | ICD-10-CM | POA: Diagnosis not present

## 2019-08-21 ENCOUNTER — Telehealth: Payer: Self-pay | Admitting: *Deleted

## 2019-08-21 NOTE — Telephone Encounter (Signed)
Received referral for low dose lung cancer screening CT scan. Message left at phone number listed in EMR for patient to call me back to facilitate scheduling scan.  

## 2019-08-23 NOTE — Telephone Encounter (Signed)
Attempted to schedule lung screening scan. Voicemail left.

## 2019-10-15 DIAGNOSIS — I10 Essential (primary) hypertension: Secondary | ICD-10-CM | POA: Diagnosis not present

## 2019-10-15 DIAGNOSIS — E349 Endocrine disorder, unspecified: Secondary | ICD-10-CM | POA: Diagnosis not present

## 2019-10-15 DIAGNOSIS — R42 Dizziness and giddiness: Secondary | ICD-10-CM | POA: Diagnosis not present

## 2019-10-24 DIAGNOSIS — Z01818 Encounter for other preprocedural examination: Secondary | ICD-10-CM | POA: Diagnosis not present

## 2019-10-24 DIAGNOSIS — H40033 Anatomical narrow angle, bilateral: Secondary | ICD-10-CM | POA: Diagnosis not present

## 2019-10-24 DIAGNOSIS — H2512 Age-related nuclear cataract, left eye: Secondary | ICD-10-CM | POA: Diagnosis not present

## 2019-10-24 DIAGNOSIS — H2511 Age-related nuclear cataract, right eye: Secondary | ICD-10-CM | POA: Diagnosis not present

## 2019-10-29 DIAGNOSIS — H2512 Age-related nuclear cataract, left eye: Secondary | ICD-10-CM | POA: Diagnosis not present

## 2019-10-29 DIAGNOSIS — H25812 Combined forms of age-related cataract, left eye: Secondary | ICD-10-CM | POA: Diagnosis not present

## 2019-11-05 DIAGNOSIS — H2511 Age-related nuclear cataract, right eye: Secondary | ICD-10-CM | POA: Diagnosis not present

## 2019-11-09 ENCOUNTER — Encounter: Payer: Self-pay | Admitting: *Deleted
